# Patient Record
Sex: Male | Born: 1985 | Race: Black or African American | Hispanic: No | Marital: Single
Health system: Southern US, Community
[De-identification: ages and names within clinical notes are randomized; demographics above are authoritative.]

## PROBLEM LIST (undated history)

## (undated) DIAGNOSIS — K219 Gastro-esophageal reflux disease without esophagitis: Secondary | ICD-10-CM

---
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
## 2005-12-27 ENCOUNTER — Ambulatory Visit: Payer: Self-pay | Admitting: Family Medicine

## 2006-10-09 DIAGNOSIS — J309 Allergic rhinitis, unspecified: Secondary | ICD-10-CM | POA: Insufficient documentation

## 2006-10-09 DIAGNOSIS — L2089 Other atopic dermatitis: Secondary | ICD-10-CM | POA: Insufficient documentation

## 2006-10-09 DIAGNOSIS — K5900 Constipation, unspecified: Secondary | ICD-10-CM | POA: Insufficient documentation

## 2006-10-09 DIAGNOSIS — K449 Diaphragmatic hernia without obstruction or gangrene: Secondary | ICD-10-CM | POA: Insufficient documentation

## 2006-10-09 DIAGNOSIS — K219 Gastro-esophageal reflux disease without esophagitis: Secondary | ICD-10-CM

## 2007-10-05 ENCOUNTER — Emergency Department (HOSPITAL_COMMUNITY): Admission: EM | Admit: 2007-10-05 | Discharge: 2007-10-06 | Payer: Self-pay | Admitting: Emergency Medicine

## 2007-12-03 ENCOUNTER — Emergency Department: Payer: Self-pay | Admitting: Unknown Physician Specialty

## 2007-12-10 ENCOUNTER — Telehealth: Payer: Self-pay | Admitting: *Deleted

## 2008-09-03 ENCOUNTER — Emergency Department: Payer: Self-pay | Admitting: Emergency Medicine

## 2010-01-28 ENCOUNTER — Emergency Department (HOSPITAL_COMMUNITY): Admission: AC | Admit: 2010-01-28 | Discharge: 2010-01-28 | Payer: Self-pay

## 2010-10-28 LAB — COMPREHENSIVE METABOLIC PANEL
ALT: 25 U/L (ref 0–53)
AST: 27 U/L (ref 0–37)
Albumin: 4 g/dL (ref 3.5–5.2)
Alkaline Phosphatase: 47 U/L (ref 39–117)
CO2: 24 mEq/L (ref 19–32)
Chloride: 105 mEq/L (ref 96–112)
GFR calc Af Amer: >60 mL/min (ref >60)
GFR calc non Af Amer: 54 mL/min — ABNORMAL LOW (ref >60)
Potassium: 3.4 mEq/L — ABNORMAL LOW (ref 3.5–5.1)
Sodium: 137 mEq/L (ref 135–145)
Total Bilirubin: 0.5 mg/dL (ref 0.3–1.2)

## 2010-10-28 LAB — TYPE AND SCREEN: Antibody Screen: NEGATIVE

## 2010-10-28 LAB — APTT: aPTT: 25 seconds (ref 24–37)

## 2010-10-28 LAB — CBC
MCV: 88.2 fL (ref 78.0–100.0)
Platelets: 227 10*3/uL (ref 150–400)
RBC: 5.28 MIL/uL (ref 4.22–5.81)
WBC: 8.7 10*3/uL (ref 4.0–10.5)

## 2010-10-28 LAB — POCT I-STAT, CHEM 8
BUN: 14 mg/dL (ref 6–23)
Calcium, Ion: 1.06 mmol/L — ABNORMAL LOW (ref 1.12–1.32)
Chloride: 104 meq/L (ref 96–112)
Glucose, Bld: 138 mg/dL — ABNORMAL HIGH (ref 70–99)
HCT: 50 % (ref 39.0–52.0)
Potassium: 3.5 meq/L (ref 3.5–5.1)

## 2010-10-28 LAB — ABO/RH: ABO/RH(D): O POS

## 2011-05-03 LAB — POCT CARDIAC MARKERS
CKMB, poc: <1 — ABNORMAL LOW
Troponin i, poc: <0.05

## 2013-01-12 ENCOUNTER — Emergency Department (HOSPITAL_COMMUNITY): Payer: Self-pay

## 2013-01-12 ENCOUNTER — Encounter (HOSPITAL_COMMUNITY): Payer: Self-pay | Admitting: *Deleted

## 2013-01-12 ENCOUNTER — Emergency Department (HOSPITAL_COMMUNITY)
Admission: EM | Admit: 2013-01-12 | Discharge: 2013-01-12 | Disposition: A | Discharge status: Home / Self Care | Payer: Self-pay | Attending: Emergency Medicine | Admitting: Emergency Medicine

## 2013-01-12 DIAGNOSIS — S6720XA Crushing injury of unspecified hand, initial encounter: Secondary | ICD-10-CM | POA: Insufficient documentation

## 2013-01-12 DIAGNOSIS — Y939 Activity, unspecified: Secondary | ICD-10-CM | POA: Insufficient documentation

## 2013-01-12 DIAGNOSIS — W19XXXA Unspecified fall, initial encounter: Secondary | ICD-10-CM | POA: Insufficient documentation

## 2013-01-12 DIAGNOSIS — S6721XA Crushing injury of right hand, initial encounter: Secondary | ICD-10-CM

## 2013-01-12 DIAGNOSIS — Y929 Unspecified place or not applicable: Secondary | ICD-10-CM | POA: Insufficient documentation

## 2013-01-12 NOTE — ED Notes (Signed)
Pt in c/o right hand injury on Saturday night, states he fell on it, swelling noted

## 2013-01-12 NOTE — ED Provider Notes (Signed)
History     CSN: [id number]  Arrival date & time 01/12/13  0028   First MD Initiated Contact with Patient 01/12/13 [phone]      Chief Complaint  Patient presents with  . Hand Injury    (Consider location/radiation/quality/duration/timing/severity/associated sxs/prior treatment) Patient is a 27 y.o. male presenting with hand injury. The history is provided by the patient.  Hand Injury Location:  Hand Injury: yes   Hand location:  R hand Pain details:    Quality:  Aching   Radiates to:  Does not radiate   Severity:  Mild   Timing:  Constant   Progression:  Unchanged Chronicity:  New Handedness:  Right-handed Dislocation: no   Foreign body present:  No foreign bodies Prior injury to area:  No Relieved by:  None tried Worsened by:  Nothing tried Ineffective treatments:  None tried Associated symptoms: no fever     History reviewed. No pertinent past medical history.  No past surgical history on file.  No family history on file.  History  Substance Use Topics  . Smoking status: Not on file  . Smokeless tobacco: Not on file  . Alcohol Use: Not on file      Review of Systems  Unable to perform ROS Constitutional: Negative for fever.  Musculoskeletal: Positive for joint swelling.  Skin: Positive for color change.  All other systems reviewed and are negative.    Allergies  Review of patient's allergies indicates no known allergies.  Home Medications   Current Outpatient Rx  Name  Route  Sig  Dispense  Refill  . acetaminophen (TYLENOL) 500 MG tablet   Oral   Take 1,000 mg by mouth every 6 (six) hours as needed for pain.           BP 138/90  Pulse 81  Temp(Src) 98.3 F (36.8 C) (Oral)  Resp 18  Ht 6\' 3"  (1.905 m)  Wt 224 lb (101.606 kg)  BMI 28 kg/m2  SpO2 99%  Physical Exam  Constitutional: He appears well-developed and well-nourished.  Eyes: Pupils are equal, round, and reactive to light.  Neck: Normal range of motion.  Cardiovascular:  Normal rate.   Pulmonary/Chest: Effort normal.  Musculoskeletal: Normal range of motion. He exhibits tenderness. He exhibits no edema.       Hands: Ecchymotic, swelling    ED Course  Procedures (including critical care time)  Labs Reviewed - No data to display Dg Hand Complete Right  01/12/2013   *RADIOLOGY REPORT*  Clinical Data: History of fall complaining of right hand pain.  RIGHT HAND - COMPLETE 3+ VIEW  Comparison: No priors.  Findings: Three views of the right hand demonstrate no acute displaced fracture, subluxation, dislocation, joint or soft tissue abnormality.  IMPRESSION: 1.  No acute radiographic abnormality of the right hand.   Original Report Authenticated By: Trudie Reed, M.D.     1. Hand crush injury, right, initial encounter       MDM  Heat therapy and regular use         Arman Filter, NP 01/12/13 1191  Arman Filter, NP 01/12/13 [phone]

## 2013-01-12 NOTE — ED Provider Notes (Signed)
Medical screening examination/treatment/procedure(s) were performed by non-physician practitioner and as supervising physician I was immediately available for consultation/collaboration.  Jasmine Awe, MD 01/12/13 [phone]

## 2013-12-10 IMAGING — CR DG HAND COMPLETE 3+V*R*
3 series · 3 of 3 positions shown · non-contrast
Comparison: No priors.

CLINICAL DATA: History of fall complaining of right hand pain.

RIGHT HAND - COMPLETE 3+ VIEW

[x hand pa right]
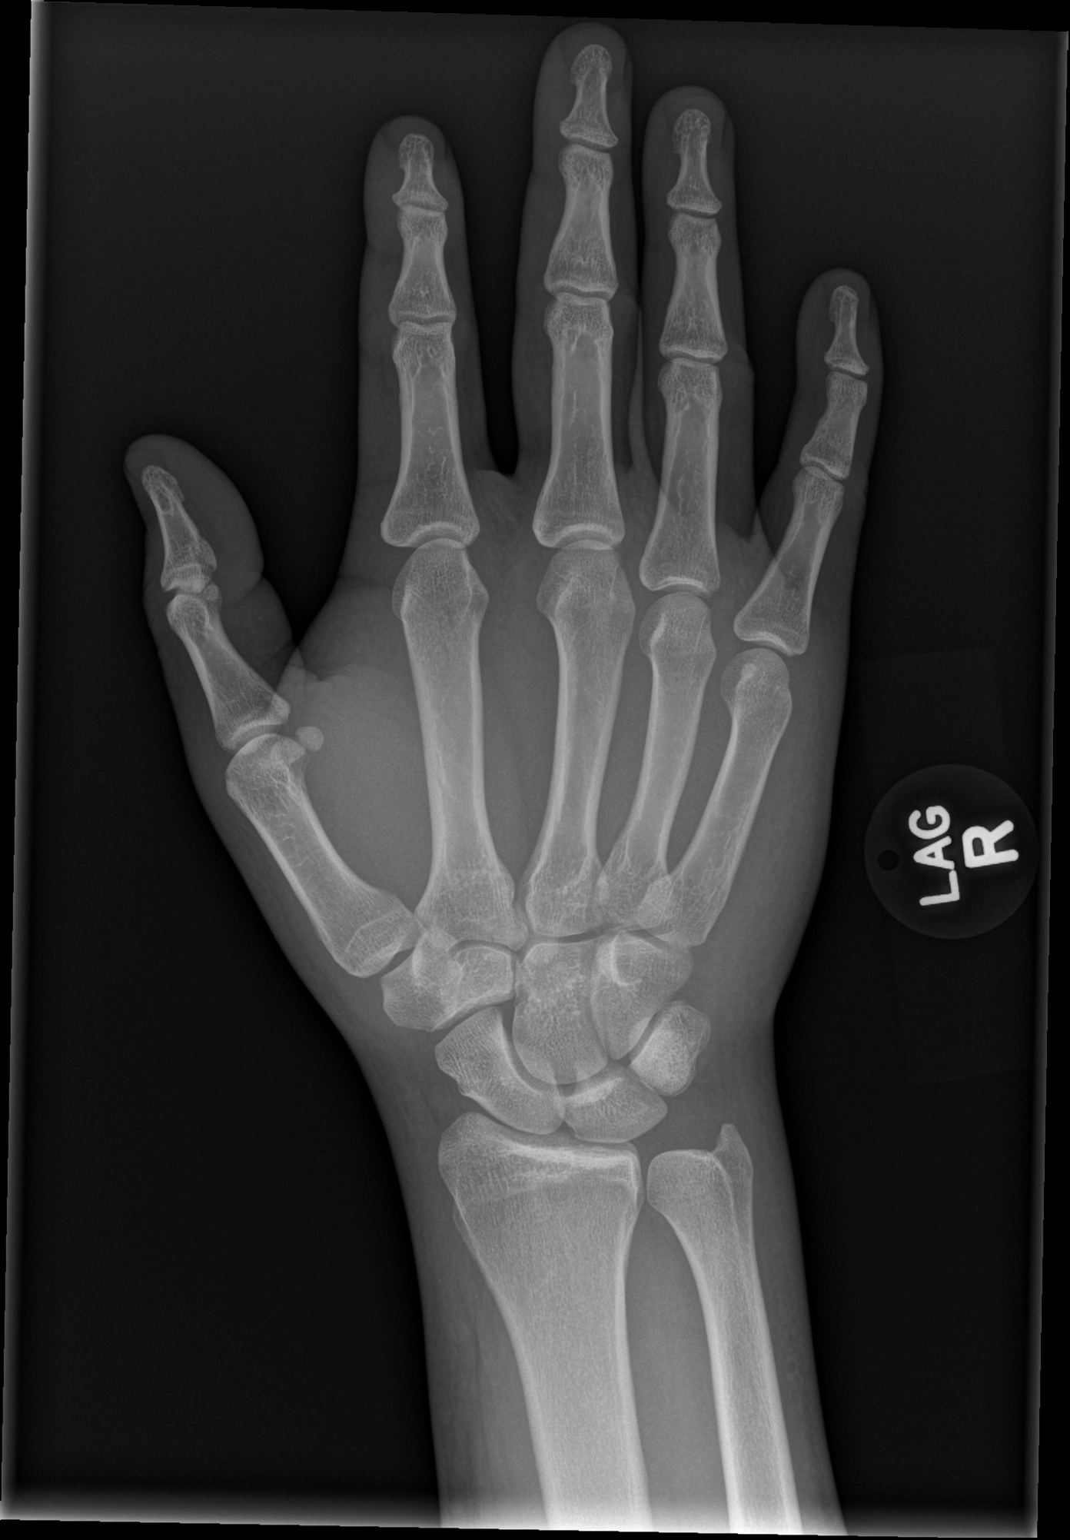

[x hand obl right]
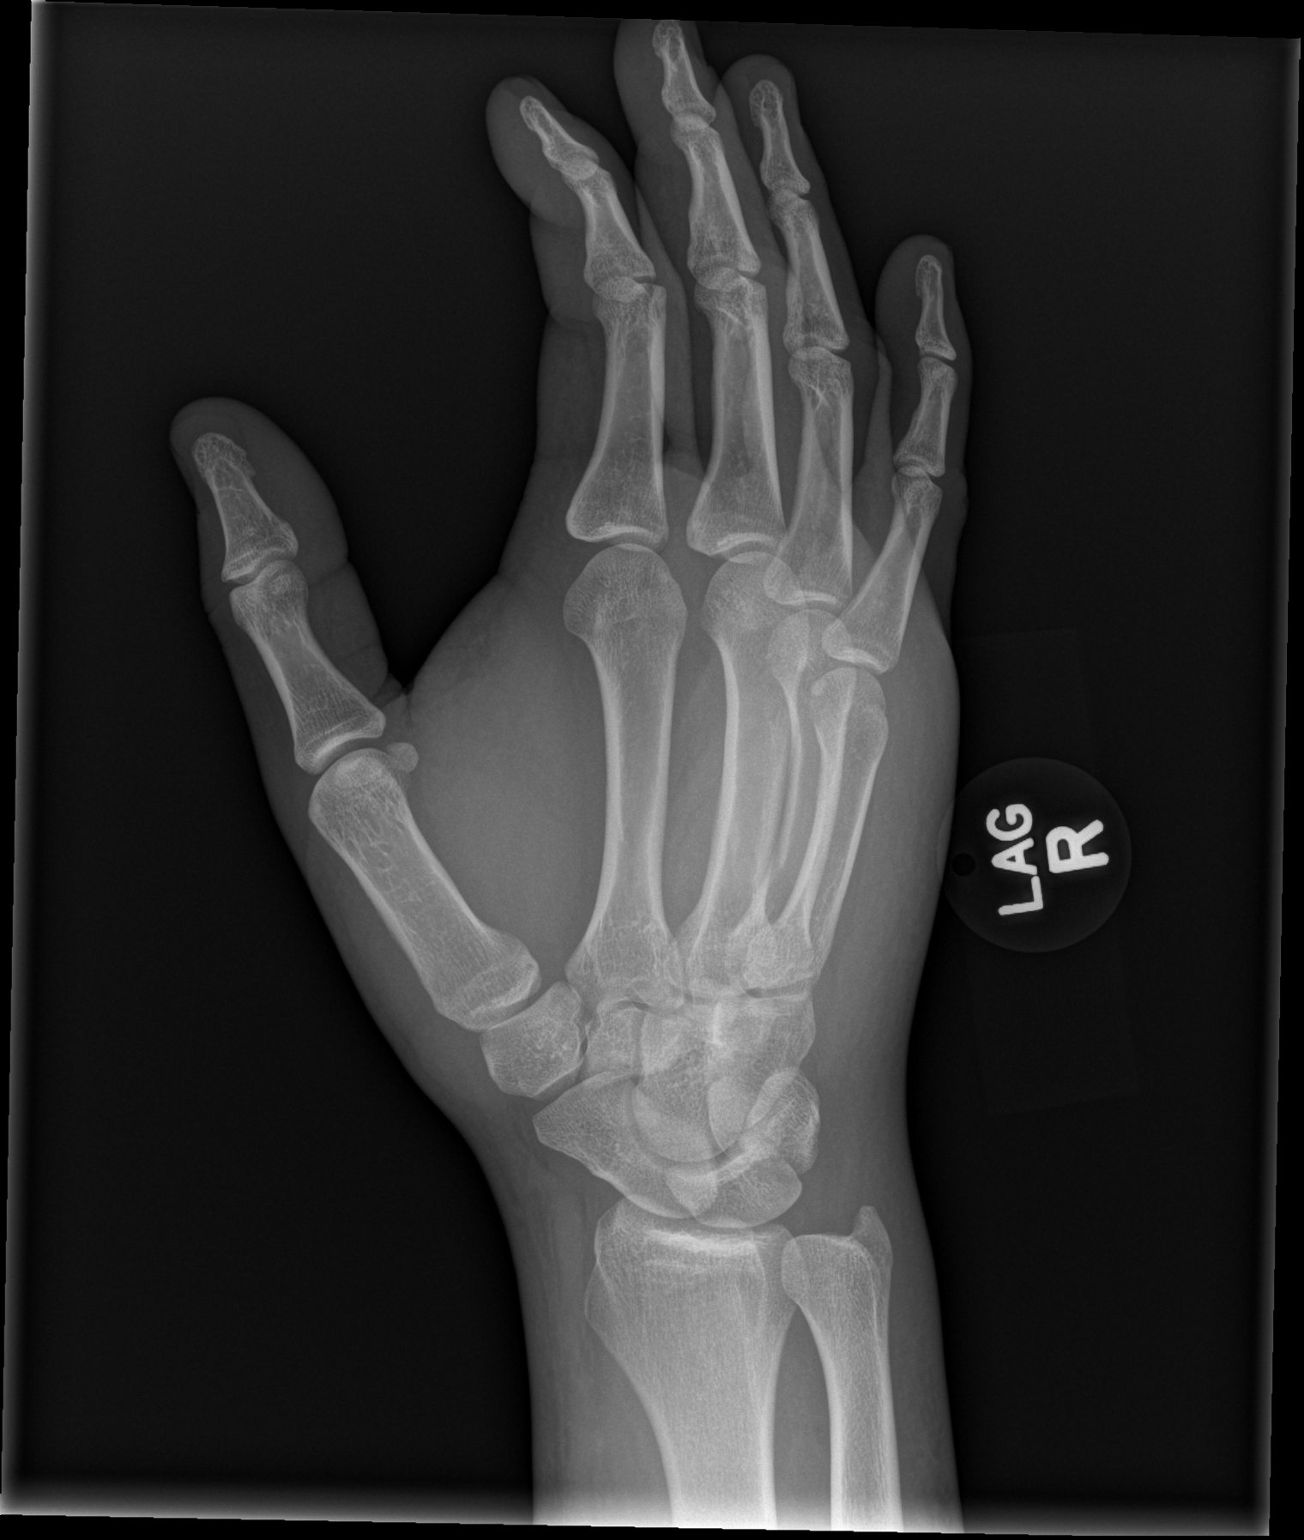

[x hand lat right]
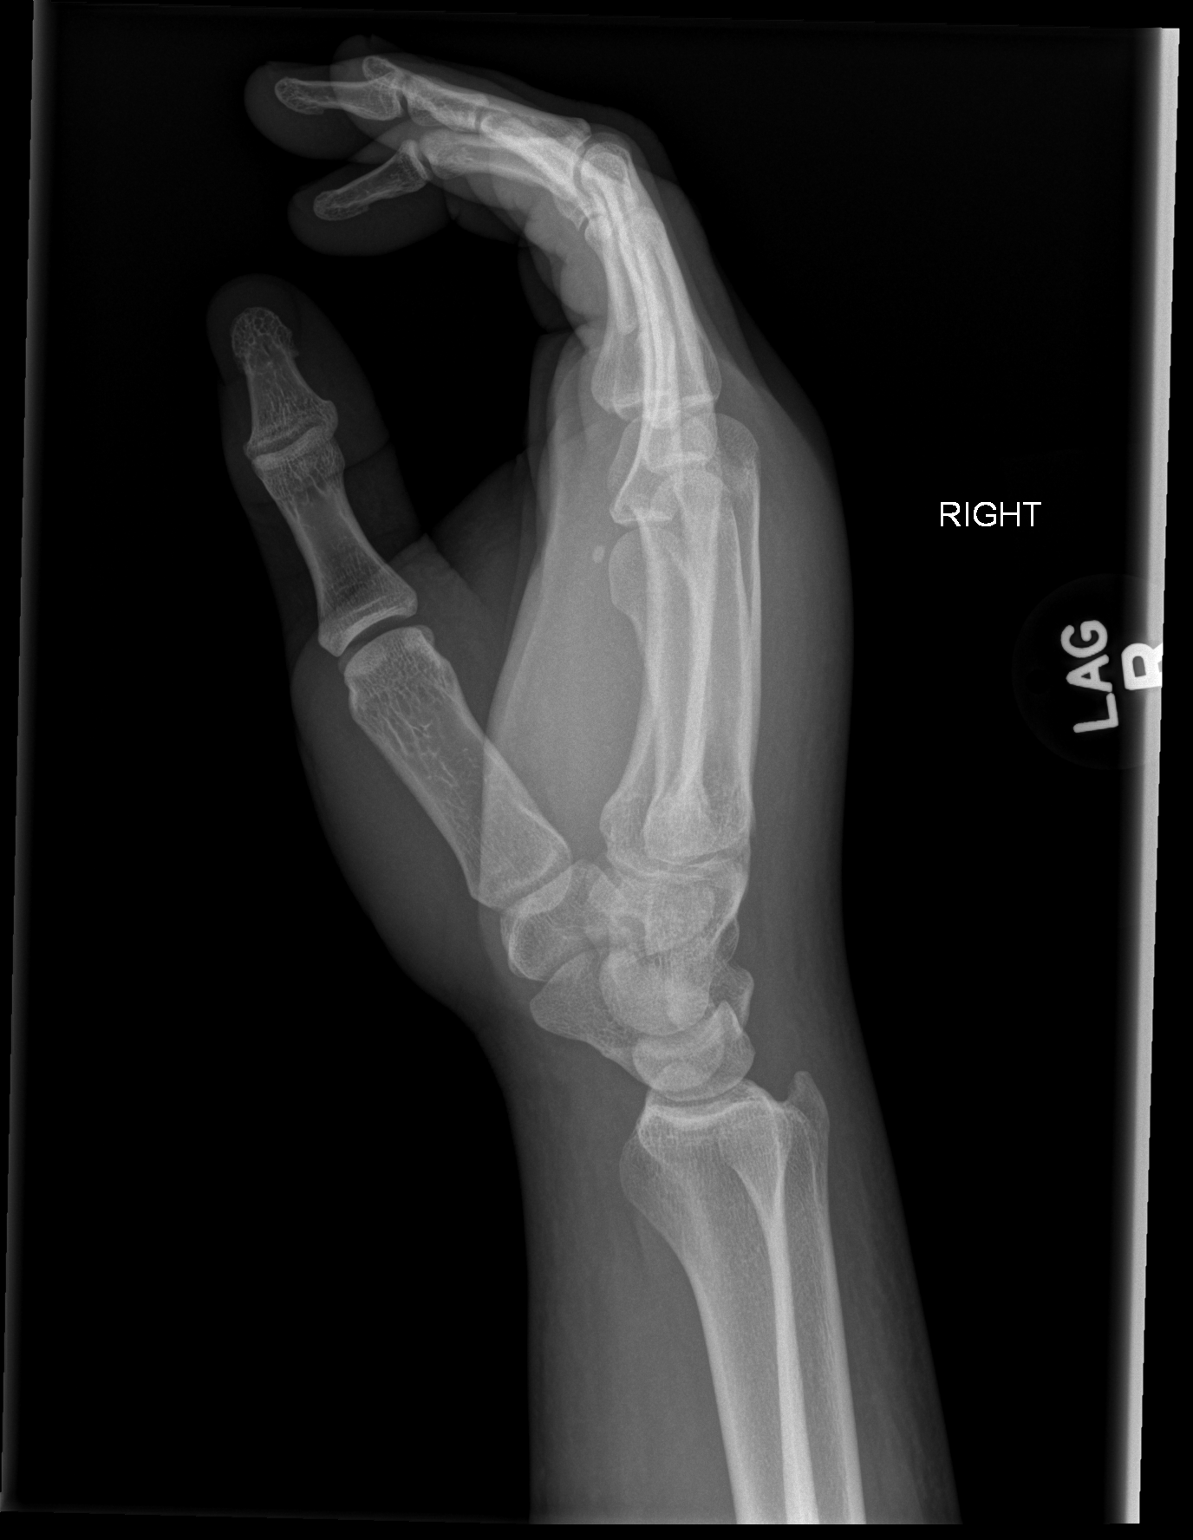

[3 of 3 positions shown; findings below may reference images not displayed]

FINDINGS: Three views of the right hand demonstrate no acute
displaced fracture, subluxation, dislocation, joint or soft tissue
abnormality.
IMPRESSION: 1.  No acute radiographic abnormality of the right hand.

## 2014-03-03 ENCOUNTER — Encounter (HOSPITAL_COMMUNITY): Payer: Self-pay | Admitting: Emergency Medicine

## 2014-03-03 ENCOUNTER — Emergency Department (HOSPITAL_COMMUNITY)
Admission: EM | Admit: 2014-03-03 | Discharge: 2014-03-04 | Disposition: A | Discharge status: Home / Self Care | Payer: Self-pay | Attending: Emergency Medicine | Admitting: Emergency Medicine

## 2014-03-03 ENCOUNTER — Emergency Department (HOSPITAL_COMMUNITY): Payer: Self-pay

## 2014-03-03 DIAGNOSIS — Z8719 Personal history of other diseases of the digestive system: Secondary | ICD-10-CM | POA: Insufficient documentation

## 2014-03-03 DIAGNOSIS — Z79899 Other long term (current) drug therapy: Secondary | ICD-10-CM | POA: Insufficient documentation

## 2014-03-03 DIAGNOSIS — F419 Anxiety disorder, unspecified: Secondary | ICD-10-CM

## 2014-03-03 DIAGNOSIS — R000 Tachycardia, unspecified: Secondary | ICD-10-CM | POA: Insufficient documentation

## 2014-03-03 DIAGNOSIS — F411 Generalized anxiety disorder: Secondary | ICD-10-CM | POA: Insufficient documentation

## 2014-03-03 DIAGNOSIS — R0602 Shortness of breath: Secondary | ICD-10-CM | POA: Insufficient documentation

## 2014-03-03 DIAGNOSIS — R0789 Other chest pain: Secondary | ICD-10-CM | POA: Insufficient documentation

## 2014-03-03 DIAGNOSIS — R079 Chest pain, unspecified: Secondary | ICD-10-CM | POA: Insufficient documentation

## 2014-03-03 HISTORY — DX: Gastro-esophageal reflux disease without esophagitis: K21.9

## 2014-03-03 LAB — BASIC METABOLIC PANEL
ANION GAP: 19 — AB (ref 5–15)
BUN: 12 mg/dL (ref 6–23)
CALCIUM: 9.9 mg/dL (ref 8.4–10.5)
CO2: 20 meq/L (ref 19–32)
CREATININE: 1.31 mg/dL (ref 0.50–1.35)
Chloride: 97 mEq/L (ref 96–112)
GFR calc Af Amer: 84 mL/min — ABNORMAL LOW (ref >90)
GFR calc non Af Amer: 73 mL/min — ABNORMAL LOW (ref >90)
Glucose, Bld: 123 mg/dL — ABNORMAL HIGH (ref 70–99)
Potassium: 3.7 mEq/L (ref 3.7–5.3)
Sodium: 136 mEq/L — ABNORMAL LOW (ref 137–147)

## 2014-03-03 LAB — CBC
HEMATOCRIT: 49.4 % (ref 39.0–52.0)
Hemoglobin: 17.3 g/dL — ABNORMAL HIGH (ref 13.0–17.0)
MCH: 29.2 pg (ref 26.0–34.0)
MCHC: 35 g/dL (ref 30.0–36.0)
MCV: 83.3 fL (ref 78.0–100.0)
PLATELETS: 271 10*3/uL (ref 150–400)
RBC: 5.93 MIL/uL — AB (ref 4.22–5.81)
RDW: 12.8 % (ref 11.5–15.5)
WBC: 5.7 10*3/uL (ref 4.0–10.5)

## 2014-03-03 LAB — D-DIMER, QUANTITATIVE: D-Dimer, Quant: <0.27 ug/mL-FEU (ref 0.00–0.48)

## 2014-03-03 LAB — I-STAT TROPONIN, ED: Troponin i, poc: 0 ng/mL (ref 0.00–0.08)

## 2014-03-03 MED ORDER — SODIUM CHLORIDE 0.9 % IV BOLUS (SEPSIS)
1000.0000 mL | Freq: Once | INTRAVENOUS | Status: AC
  Administered 2014-03-03: 1000 mL via INTRAVENOUS

## 2014-03-03 MED ORDER — LORAZEPAM 2 MG/ML IJ SOLN
0.5000 mg | Freq: Once | INTRAMUSCULAR | Status: AC
  Administered 2014-03-03: 0.5 mg via INTRAVENOUS
  Filled 2014-03-03: qty 1

## 2014-03-03 NOTE — ED Notes (Signed)
Patient with c/o "chest pain and shortness of breath" since yesterday Patient states that he has a hx of "acid reflux" which was unrelieved by Tums Patient noted to be in ST on monitor Patient denies N/V/D Patient noted to be hypertensive--denies blurred vision, but endorses a headache

## 2014-03-03 NOTE — ED Provider Notes (Signed)
CSN: [id number]     Arrival date & time 03/03/14  2115 History   First MD Initiated Contact with Patient 03/03/14 2147     Chief Complaint  Patient presents with  . Chest Pain  . Shortness of Breath     (Consider location/radiation/quality/duration/timing/severity/associated sxs/prior Treatment) HPI Patient states he was watching a frightening movie last night and started having chest tightness and shortness of breath. No previous episodes of similar. Denies any cough, fever or chills. Chest tightness is mostly but located in the right chest. It does not radiate. No recent travel or surgeries. No calf swelling or pain. A family history of DVT/PE or coronary artery disease. Patient denies smoking or drug use. Denies excessive caffeine intake. States he drinks occasionally Past Medical History  Diagnosis Date  . GERD (gastroesophageal reflux disease)    History reviewed. No pertinent past surgical history. History reviewed. No pertinent family history. History  Substance Use Topics  . Smoking status: Unknown If Ever Smoked  . Smokeless tobacco: Not on file  . Alcohol Use: No    Review of Systems  Constitutional: Negative for fever and chills.  Eyes: Negative for visual disturbance.  Respiratory: Positive for shortness of breath. Negative for cough, chest tightness and wheezing.   Cardiovascular: Positive for chest pain. Negative for palpitations.  Gastrointestinal: Negative for nausea, vomiting, abdominal pain, diarrhea and constipation.  Musculoskeletal: Negative for back pain, myalgias, neck pain and neck stiffness.  Skin: Negative for rash and wound.  Neurological: Negative for dizziness, weakness, light-headedness, numbness and headaches.  All other systems reviewed and are negative.     Allergies  Review of patient's allergies indicates no known allergies.  Home Medications   Prior to Admission medications   Medication Sig Start Date End Date Taking? Authorizing  Provider  acetaminophen (TYLENOL) 500 MG tablet Take 500 mg by mouth every 6 (six) hours as needed (pain).    Yes Historical Provider, MD   BP 187/102  Pulse 128  Temp(Src) 98.6 F (37 C)  Resp 22  SpO2 100% Physical Exam  Nursing note and vitals reviewed. Constitutional: He is oriented to person, place, and time. He appears well-developed and well-nourished. No distress.  HENT:  Head: Normocephalic and atraumatic.  Mouth/Throat: Oropharynx is clear and moist.  Eyes: EOM are normal. Pupils are equal, round, and reactive to light.  Pupils 4-5 mm bilaterally and reactive  Neck: Normal range of motion. Neck supple.  No meningismus  Cardiovascular: Regular rhythm.   Tachycardia  Pulmonary/Chest: Effort normal and breath sounds normal. No respiratory distress. He has no wheezes. He has no rales. He exhibits no tenderness.  Gynecomastia  Abdominal: Soft. Bowel sounds are normal. He exhibits no distension and no mass. There is no tenderness. There is no rebound and no guarding.  Musculoskeletal: Normal range of motion. He exhibits no edema and no tenderness.  No calf swelling or tenderness.  Neurological: He is alert and oriented to person, place, and time.  Moves all extremities without deficit. Sensation is grossly intact.  Skin: Skin is warm and dry. No rash noted. No erythema.  Psychiatric: He has a normal mood and affect. His behavior is normal.    ED Course  Procedures (including critical care time) Labs Review Labs Reviewed  CBC - Abnormal; Notable for the following:    RBC 5.93 (*)    Hemoglobin 17.3 (*)    All other components within normal limits  BASIC METABOLIC PANEL - Abnormal; Notable for the following:  Sodium 136 (*)    Glucose, Bld 123 (*)    GFR calc non Af Amer 73 (*)    GFR calc Af Amer 84 (*)    Anion gap 19 (*)    All other components within normal limits  D-DIMER, QUANTITATIVE  URINE RAPID DRUG SCREEN (HOSP PERFORMED)  [facility] SensorI-STAT TROPOININ, ED     Imaging Review Dg Chest Port 1 View  03/03/2014   CLINICAL DATA:  Chest pain for 2 days. Shortness of breath, chills and weakness.  EXAM: PORTABLE CHEST - 1 VIEW  COMPARISON:  Chest radiograph performed 10/06/2007  FINDINGS: The lungs are well-aerated. Pulmonary vascularity is at the upper limits of normal. There is no evidence of focal opacification, pleural effusion or pneumothorax.  The cardiomediastinal silhouette is within normal limits. No acute osseous abnormalities are seen. A tiny metallic density overlying the right lung apex may be outside the patient, possibly reflecting debris.  IMPRESSION: No acute cardiopulmonary process seen.   Electronically Signed   By: Roanna RaiderJeffery  Chang M.D.   On: 03/03/2014 22:18     EKG Interpretation   Date/Time:  Thursday March 03 2014 21:26:03 EDT Ventricular Rate:  119 PR Interval:  115 QRS Duration: 88 QT Interval:  464 QTC Calculation: 653 R Axis:   82 Text Interpretation:  Sinus tachycardia Prolonged QT interval Confirmed by  HORTON  MD, COURTNEY ([id number]) on 03/03/2014 9:57:56 PM      MDM   Final diagnoses:  None      Patient states he is feeling much better after IV Ativan and fluids. Heart rate is now under 100. Workup is essentially negative. Chest tightness and shortness of breath likely related to anxiety. Will start on low-dose benzoin as needed. Patient advised to return for worsening symptoms or any concerns.  Loren Raceravid Kolden Dupee, MD 03/04/14 [phone]

## 2014-03-04 LAB — RAPID URINE DRUG SCREEN, HOSP PERFORMED
AMPHETAMINES: NOT DETECTED
BENZODIAZEPINES: NOT DETECTED
Barbiturates: NOT DETECTED
COCAINE: NOT DETECTED
OPIATES: NOT DETECTED
TETRAHYDROCANNABINOL: NOT DETECTED

## 2014-03-04 MED ORDER — ALPRAZOLAM 0.25 MG PO TABS: 0.2500 mg | ORAL_TABLET | Freq: Three times a day (TID) | ORAL | Status: AC | PRN

## 2014-03-04 NOTE — Discharge Instructions (Signed)
Panic Attacks Panic attacks are sudden, short feelings of great fear or discomfort. You may have them for no reason when you are relaxed, when you are uneasy (anxious), or when you are sleeping.  HOME CARE  Take all your medicines as told.  Check with your doctor before starting new medicines.  Keep all doctor visits. GET HELP IF:  You are not able to take your medicines as told.  Your symptoms do not get better.  Your symptoms get worse. GET HELP RIGHT AWAY IF:  Your attacks seem different than your normal attacks.  You have thoughts about hurting yourself or others.  You take panic attack medicine and you have a side effect. MAKE SURE YOU:  Understand these instructions.  Will watch your condition.  Will get help right away if you are not doing well or get worse. Document Released: 08/31/2010 Document Revised: 05/19/2013 Document Reviewed: 03/12/2013 [facility] Patient Information 2015 [place], [place]. This information is not intended to replace advice given to you by your health care provider. Make sure you discuss any questions you have with your health care provider.  Chest Pain (Nonspecific) It is often hard to give a specific diagnosis for the cause of chest pain. There is always a chance that your pain could be related to something serious, such as a heart attack or a blood clot in the lungs. You need to follow up with your health care provider for further evaluation. CAUSES   Heartburn.  Pneumonia or bronchitis.  Anxiety or stress.  Inflammation around your heart (pericarditis) or lung (pleuritis or pleurisy).  A blood clot in the lung.  A collapsed lung (pneumothorax). It can develop suddenly on its own (spontaneous pneumothorax) or from trauma to the chest.  Shingles infection (herpes zoster virus). The chest wall is composed of bones, muscles, and cartilage. Any of these can be the source of the pain.  The bones can be bruised by injury.  The muscles or  cartilage can be strained by coughing or overwork.  The cartilage can be affected by inflammation and become sore (costochondritis). DIAGNOSIS  Lab tests or other studies may be needed to find the cause of your pain. Your health care provider may have you take a test called an ambulatory electrocardiogram (ECG). An ECG records your heartbeat patterns over a 24-hour period. You may also have other tests, such as:  Transthoracic echocardiogram (TTE). During echocardiography, sound waves are used to evaluate how blood flows through your heart.  Transesophageal echocardiogram (TEE).  Cardiac monitoring. This allows your health care provider to monitor your heart rate and rhythm in real time.  Holter monitor. This is a portable device that records your heartbeat and can help diagnose heart arrhythmias. It allows your health care provider to track your heart activity for several days, if needed.  Stress tests by exercise or by giving medicine that makes the heart beat faster. TREATMENT   Treatment depends on what may be causing your chest pain. Treatment may include:  Acid blockers for heartburn.  Anti-inflammatory medicine.  Pain medicine for inflammatory conditions.  Antibiotics if an infection is present.  You may be advised to change lifestyle habits. This includes stopping smoking and avoiding alcohol, caffeine, and chocolate.  You may be advised to keep your head raised (elevated) when sleeping. This reduces the chance of acid going backward from your stomach into your esophagus. Most of the time, nonspecific chest pain will improve within 2-3 days with rest and mild pain medicine.  HOME  INSTRUCTIONS  °· If antibiotics were prescribed, take them as directed. Finish them even if you start to feel better. °· For the next few days, avoid physical activities that bring on chest pain. Continue physical activities as directed. °· Do not use any tobacco products, including cigarettes,  chewing tobacco, or electronic cigarettes. °· Avoid drinking alcohol. °· Only take medicine as directed by your health care provider. °· Follow your health care provider's suggestions for further testing if your chest pain does not go away. °· Keep any follow-up appointments you made. If you do not go to an appointment, you could develop lasting (chronic) problems with pain. If there is any problem keeping an appointment, call to reschedule. °SEEK MEDICAL CARE IF:  °· Your chest pain does not go away, even after treatment. °· You have a rash with blisters on your chest. °· You have a fever. °SEEK IMMEDIATE MEDICAL CARE IF:  °· You have increased chest pain or pain that spreads to your arm, neck, jaw, back, or abdomen. °· You have shortness of breath. °· You have an increasing cough, or you cough up blood. °· You have severe back or abdominal pain. °· You feel nauseous or vomit. °· You have severe weakness. °· You faint. °· You have chills. °This is an emergency. Do not wait to see if the pain will go away. Get medical help at once. Call your local emergency services (911 in [place] Do not drive yourself to the hospital. °MAKE SURE YOU:  °· Understand these instructions. °· Will watch your condition. °· Will get help right away if you are not doing well or get worse. °Document Released: 05/08/2005 Document Revised: 08/03/2013 Document Reviewed: 03/03/2008 °ExitCare® Patient Information ©2015 [employer]. This information is not intended to replace advice given to you by your health care provider. Make sure you discuss any questions you have with your health care provider. ° °

## 2014-04-07 ENCOUNTER — Ambulatory Visit: Payer: Self-pay

## 2014-05-13 ENCOUNTER — Encounter (HOSPITAL_COMMUNITY): Payer: Self-pay | Admitting: Emergency Medicine

## 2014-05-13 ENCOUNTER — Emergency Department (INDEPENDENT_AMBULATORY_CARE_PROVIDER_SITE_OTHER)
Admission: EM | Admit: 2014-05-13 | Discharge: 2014-05-13 | Disposition: A | Discharge status: Home / Self Care | Payer: PRIVATE HEALTH INSURANCE | Source: Home / Self Care | Attending: Family Medicine | Admitting: Family Medicine

## 2014-05-13 DIAGNOSIS — I10 Essential (primary) hypertension: Secondary | ICD-10-CM

## 2014-05-13 LAB — POCT I-STAT, CHEM 8
BUN: 9 mg/dL (ref 6–23)
Calcium, Ion: 1.13 mmol/L (ref 1.12–1.23)
Chloride: 103 mEq/L (ref 96–112)
Creatinine, Ser: 1.4 mg/dL — ABNORMAL HIGH (ref 0.50–1.35)
Glucose, Bld: 126 mg/dL — ABNORMAL HIGH (ref 70–99)
HEMATOCRIT: 51 % (ref 39.0–52.0)
HEMOGLOBIN: 17.3 g/dL — AB (ref 13.0–17.0)
POTASSIUM: 3.5 meq/L — AB (ref 3.7–5.3)
SODIUM: 142 meq/L (ref 137–147)
TCO2: 28 mmol/L (ref 0–100)

## 2014-05-13 MED ORDER — METOPROLOL TARTRATE 25 MG PO TABS: 100.0000 mg | ORAL_TABLET | Freq: Two times a day (BID) | ORAL | Status: DC

## 2014-05-13 MED ORDER — METOPROLOL TARTRATE 25 MG PO TABS: 25.0000 mg | ORAL_TABLET | Freq: Two times a day (BID) | ORAL | Status: AC

## 2014-05-13 MED ORDER — HYDROCHLOROTHIAZIDE 12.5 MG PO TABS: 12.5000 mg | ORAL_TABLET | Freq: Every day | ORAL | Status: AC

## 2014-05-13 NOTE — ED Notes (Signed)
Con[facility] about HTN [facility] BP today is 151/88 and pulse is 124 Asymptomatic Alert, no signs of acute distress.

## 2014-05-13 NOTE — ED Provider Notes (Addendum)
Matthew Short is a 28 y.o. male who presents to Urgent Care today for elevated blood pressure. Patient has a several month history of elevated blood pressure associated with intermittent headache. He currently feels well no chest pain palpitations or shortness of breath. He is having trouble establishing with a primary care physician is a does not have health insurance.   Past Medical History  Diagnosis Date  . GERD (gastroesophageal reflux disease)    History  Substance Use Topics  . Smoking status: Unknown If Ever Smoked  . Smokeless tobacco: Not on file  . Alcohol Use: No   ROS as above Medications: No current facility-administered medications for this encounter.   Current Outpatient Prescriptions  Medication Sig Dispense Refill  . acetaminophen (TYLENOL) 500 MG tablet Take 500 mg by mouth every 6 (six) hours as needed (pain).       Marland Kitchen. ALPRAZolam (XANAX) 0.25 MG tablet Take 1 tablet (0.25 mg total) by mouth 3 (three) times daily as needed for anxiety.  10 tablet  0  . hydrochlorothiazide (HYDRODIURIL) 12.5 MG tablet Take 1 tablet (12.5 mg total) by mouth daily.  30 tablet  2  . metoprolol (LOPRESSOR) 25 MG tablet Take 4 tablets (100 mg total) by mouth 2 (two) times daily.  60 tablet  1    Exam:  BP 151/88  Pulse 124  Temp(Src) 97.5 F (36.4 C) (Oral)  Resp 18  SpO2 98% Gen: Well NAD HEENT: EOMI,  MMM Lungs: Normal work of breathing. CTABL Heart: RRR no MRG heart rate 90 beats per minute per manual check. Abd: NABS, Soft. Nondistended, Nontender Exts: Brisk capillary refill, warm and well perfused.   Twelve-lead EKG shows normal sinus rhythm at 93 beats per minute. No ST segment elevation or depression.  Q waves in lead II, III, and V6.  Unchanged from prior EKG  Results for orders placed during the hospital encounter of 05/13/14 (from the past 24 hour(s))  POCT I-STAT, CHEM 8     Status: Abnormal   Collection Time    05/13/14  9:51 AM      Result Value Ref Range   Sodium 142  137 - 147 mEq/L   Potassium 3.5 (*) 3.7 - 5.3 mEq/L   Chloride 103  96 - 112 mEq/L   BUN 9  6 - 23 mg/dL   Creatinine, Ser 5.621.40 (*) 0.50 - 1.35 mg/dL   Glucose, Bld 130126 (*) 70 - 99 mg/dL   Calcium, Ion 8.651.13  7.841.12 - 1.23 mmol/L   TCO2 28  0 - 100 mmol/L   Hemoglobin 17.3 (*) 13.0 - 17.0 g/dL   HCT 69.651.0  29.539.0 - 28.452.0 %  GFR 78 No results found.  Assessment and Plan: 28 y.o. male with  Hypertension. Plan to treat with low-dose hydrochlorothiazide and metoprolol. [facility] Heart rate normalized with EKG. Old Q waves unchanged. PCP possible referral to cardiology for further evaluation in the future.  Discussed warning signs or symptoms. Please see discharge instructions. Patient expresses understanding.     Rodolph BongEvan S Loriene Taunton, MD 05/13/14 1006  Rodolph BongEvan S Natina Wiginton, MD 05/13/14 1009

## 2014-05-13 NOTE — Discharge Instructions (Signed)
Thank you for coming in today. Take hydrochlorothiazide daily and metoprolol twice daily for blood pressure control. Call or go to the emergency room if you get worse, have trouble breathing, have chest pains, or palpitations.   Hypertension Hypertension, commonly called high blood pressure, is when the force of blood pumping through your arteries is too strong. Your arteries are the blood vessels that carry blood from your heart throughout your body. A blood pressure reading consists of a higher number over a lower number, such as 110/72. The higher number (systolic) is the pressure inside your arteries when your heart pumps. The lower number (diastolic) is the pressure inside your arteries when your heart relaxes. Ideally you want your blood pressure below 120/80. Hypertension forces your heart to work harder to pump blood. Your arteries may become narrow or stiff. Having hypertension puts you at risk for heart disease, stroke, and other problems.  RISK FACTORS Some risk factors for high blood pressure are controllable. Others are not.  Risk factors you cannot control include:   Race. You may be at higher risk if you are African American.  Age. Risk increases with age.  Gender. Men are at higher risk than women before age 20 years. After age 24, women are at higher risk than men. Risk factors you can control include:  Not getting enough exercise or physical activity.  Being overweight.  Getting too much fat, sugar, calories, or salt in your diet.  Drinking too much alcohol. SIGNS AND SYMPTOMS Hypertension does not usually cause signs or symptoms. Extremely high blood pressure (hypertensive crisis) may cause headache, anxiety, shortness of breath, and nosebleed. DIAGNOSIS  To check if you have hypertension, your health care provider will measure your blood pressure while you are seated, with your arm held at the level of your heart. It should be measured at least twice using the same  arm. Certain conditions can cause a difference in blood pressure between your right and left arms. A blood pressure reading that is higher than normal on one occasion does not mean that you need treatment. If one blood pressure reading is high, ask your health care provider about having it checked again. TREATMENT  Treating high blood pressure includes making lifestyle changes and possibly taking medicine. Living a healthy lifestyle can help lower high blood pressure. You may need to change some of your habits. Lifestyle changes may include:  Following the DASH diet. This diet is high in fruits, vegetables, and whole grains. It is low in salt, red meat, and added sugars.  Getting at least 2 hours of brisk physical activity every week.  Losing weight if necessary.  Not smoking.  Limiting alcoholic beverages.  Learning ways to reduce stress. If lifestyle changes are not enough to get your blood pressure under control, your health care provider may prescribe medicine. You may need to take more than one. Work closely with your health care provider to understand the risks and benefits. HOME CARE INSTRUCTIONS  Have your blood pressure rechecked as directed by your health care provider.   Take medicines only as directed by your health care provider. Follow the directions carefully. Blood pressure medicines must be taken as prescribed. The medicine does not work as well when you skip doses. Skipping doses also puts you at risk for problems.   Do not smoke.   Monitor your blood pressure at home as directed by your health care provider. SEEK MEDICAL CARE IF:   You think you are having a reaction  to medicines taken.  You have recurrent headaches or feel dizzy.  You have swelling in your ankles.  You have trouble with your vision. SEEK IMMEDIATE MEDICAL CARE IF:  You develop a severe headache or confusion.  You have unusual weakness, numbness, or feel faint.  You have severe chest  or abdominal pain.  You vomit repeatedly.  You have trouble breathing. MAKE SURE YOU:   Understand these instructions.  Will watch your condition.  Will get help right away if you are not doing well or get worse. Document Released: 07/29/2005 Document Revised: 12/13/2013 Document Reviewed: 05/21/2013 [facility] Patient Information 2015 [place], [place]. This information is not intended to replace advice given to you by your health care provider. Make sure you discuss any questions you have with your health care provider.

## 2014-05-20 ENCOUNTER — Ambulatory Visit: Payer: PRIVATE HEALTH INSURANCE | Admitting: Family Medicine

## 2015-01-29 IMAGING — CR DG CHEST 1V PORT
1 series · 1 of 1 positions shown · non-contrast
Comparison: Chest radiograph performed 10/06/2007

CLINICAL DATA: Chest pain for 2 days. Shortness of breath, chills
and weakness.

EXAM:
PORTABLE CHEST - 1 VIEW

[AP]
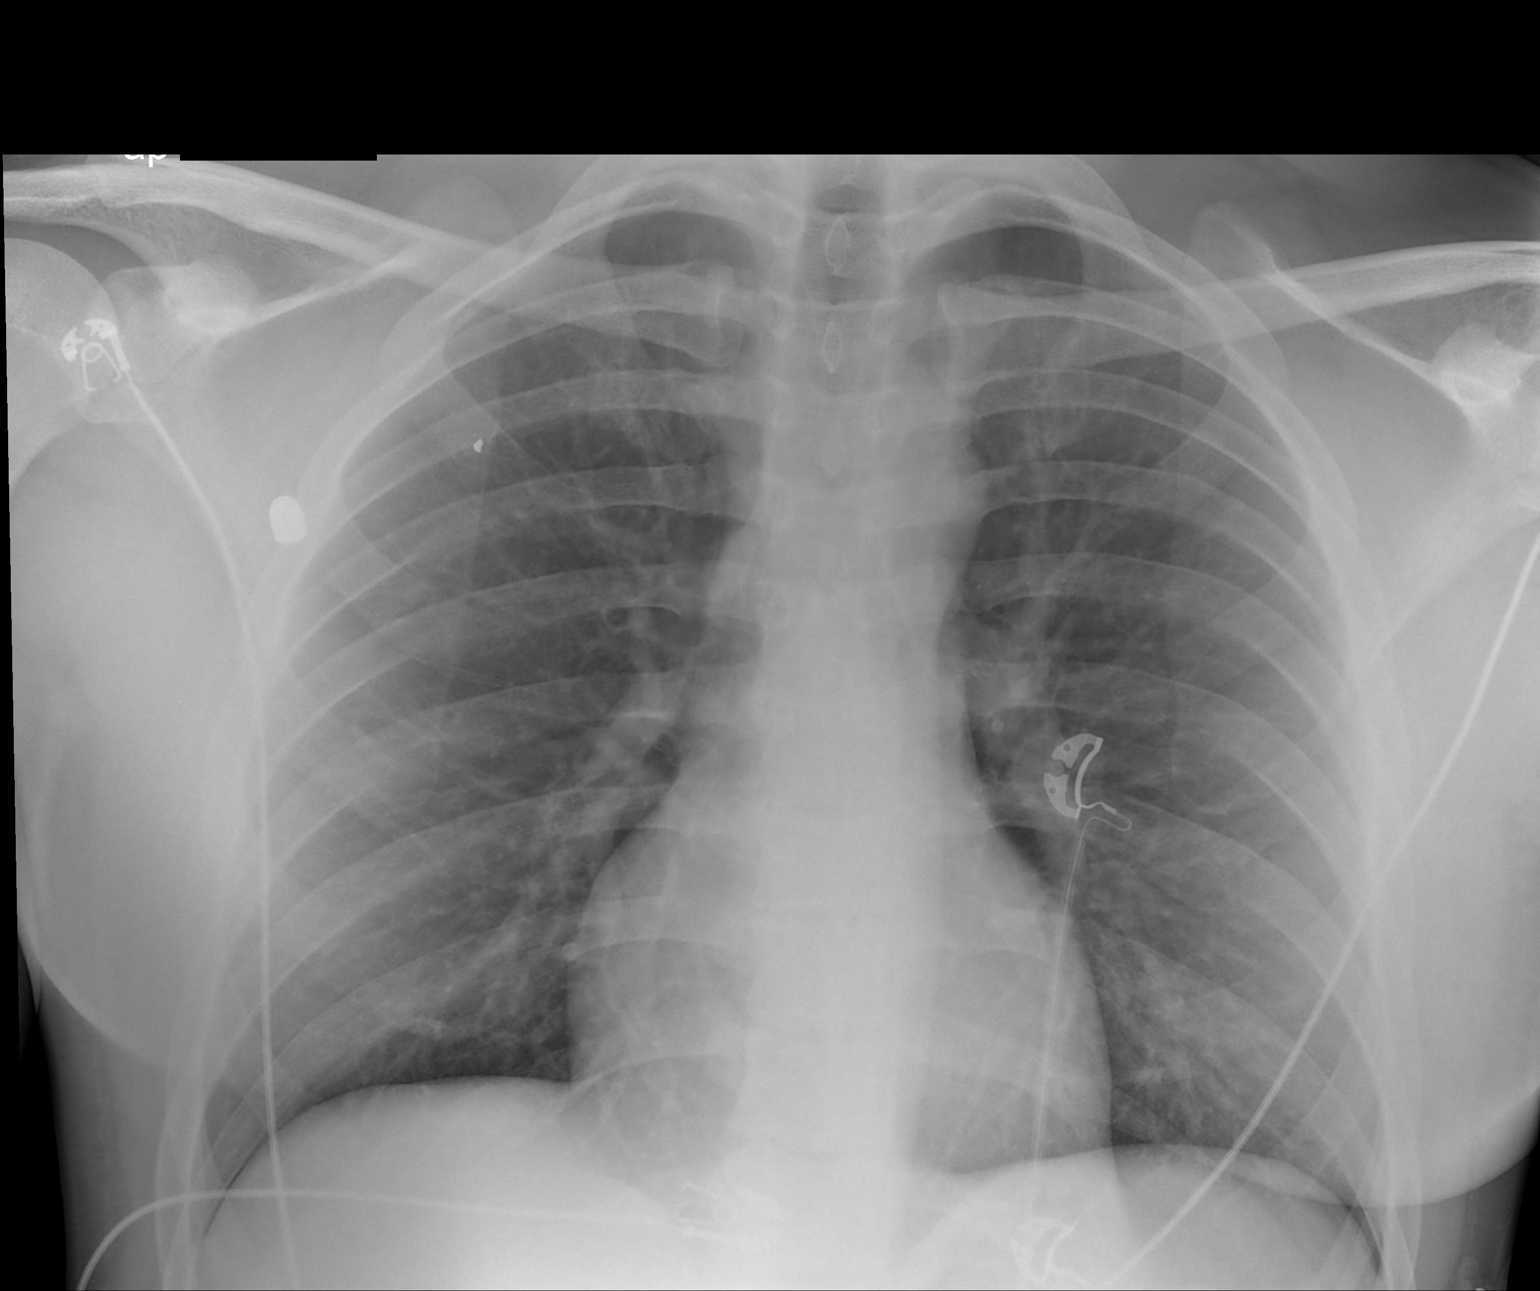

[1 of 1 positions shown; findings below may reference images not displayed]

FINDINGS: The lungs are well-aerated. Pulmonary vascularity is at the upper
limits of normal. There is no evidence of focal opacification,
pleural effusion or pneumothorax.

The cardiomediastinal silhouette is within normal limits. No acute
osseous abnormalities are seen. A tiny metallic density overlying
the right lung apex may be outside the patient, possibly reflecting
debris.
IMPRESSION: No acute cardiopulmonary process seen.

## 2017-04-09 ENCOUNTER — Emergency Department (HOSPITAL_COMMUNITY)
Admission: EM | Admit: 2017-04-09 | Discharge: 2017-04-09 | Disposition: A | Discharge status: Home / Self Care | Payer: Self-pay | Attending: Emergency Medicine | Admitting: Emergency Medicine

## 2017-04-09 DIAGNOSIS — I10 Essential (primary) hypertension: Secondary | ICD-10-CM | POA: Insufficient documentation

## 2017-04-09 DIAGNOSIS — F439 Reaction to severe stress, unspecified: Secondary | ICD-10-CM | POA: Insufficient documentation

## 2017-04-09 LAB — BASIC METABOLIC PANEL
Anion gap: 10 (ref 5–15)
BUN: 10 mg/dL (ref 6–20)
CO2: 25 mmol/L (ref 22–32)
Calcium: 9.8 mg/dL (ref 8.9–10.3)
Chloride: 105 mmol/L (ref 101–111)
Creatinine, Ser: 1.4 mg/dL — ABNORMAL HIGH (ref 0.61–1.24)
GFR calc Af Amer: >60 mL/min (ref >60)
GFR calc non Af Amer: >60 mL/min (ref >60)
Glucose, Bld: 103 mg/dL — ABNORMAL HIGH (ref 65–99)
Potassium: 3.3 mmol/L — ABNORMAL LOW (ref 3.5–5.1)
Sodium: 140 mmol/L (ref 135–145)

## 2017-04-09 MED ORDER — METOPROLOL SUCCINATE ER 25 MG PO TB24: 25.0000 mg | ORAL_TABLET | Freq: Every day | ORAL | 1 refills | Status: AC

## 2017-04-09 MED ORDER — LORAZEPAM 1 MG PO TABS
1.0000 mg | ORAL_TABLET | Freq: Once | ORAL | Status: AC
Start: 2017-04-09 — End: 2017-04-09
  Administered 2017-04-09: 1 mg via ORAL
  Filled 2017-04-09: qty 1

## 2017-04-09 MED ORDER — METOPROLOL TARTRATE 25 MG PO TABS
25.0000 mg | ORAL_TABLET | Freq: Once | ORAL | Status: AC
  Administered 2017-04-09: 25 mg via ORAL
  Filled 2017-04-09: qty 1

## 2017-04-09 NOTE — ED Triage Notes (Signed)
Pt states he has a hx of anxiety and hypertension and today he was out in the heat and felt overwhelmed. Hypertensive and tachycardic at this time. Not taking meds.

## 2017-04-09 NOTE — ED Notes (Signed)
Pt ambulatory and independent at discharge.  Verbalized understanding of discharge instructions 

## 2017-05-02 NOTE — ED Provider Notes (Signed)
[facility]-EMERGENCY DEPT Provider Note   CSN: [id number] Arrival date & time: 04/09/17  1335     History   Chief Complaint Chief Complaint  Patient presents with  . Anxiety  . Hypertension    HPI Matthew Short is a 31 y.o. male.  HPI   31yM with anxiety. Says he felt very hot and overwhelmed while at work today. Felt like he just needed to get out of there. A lot of stress with upcoming court concerning custody of his child.   Past Medical History:  Diagnosis Date  . GERD (gastroesophageal reflux disease)     Patient Active Problem List   Diagnosis Date Noted  . RHINITIS, ALLERGIC 10/09/2006  . GASTROESOPHAGEAL REFLUX, NO ESOPHAGITIS 10/09/2006  . HERNIA, HIATAL, NONCONGENITAL 10/09/2006  . CONSTIPATION 10/09/2006  . ECZEMA, ATOPIC DERMATITIS 10/09/2006    No past surgical history on file.     Home Medications    Prior to Admission medications   Medication Sig Start Date End Date Taking? Authorizing Provider  ALPRAZolam (XANAX) 0.25 MG tablet Take 1 tablet (0.25 mg total) by mouth 3 (three) times daily as needed for anxiety. Patient not taking: Reported on 04/09/2017 03/04/14   Loren Racer, MD  hydrochlorothiazide (HYDRODIURIL) 12.5 MG tablet Take 1 tablet (12.5 mg total) by mouth daily. Patient not taking: Reported on 04/09/2017 05/13/14   Rodolph Bong, MD  metoprolol succinate (TOPROL-XL) 25 MG 24 hr tablet Take 1 tablet (25 mg total) by mouth daily. 04/09/17   Raeford Razor, MD  metoprolol tartrate (LOPRESSOR) 25 MG tablet Take 1 tablet (25 mg total) by mouth 2 (two) times daily. Patient not taking: Reported on 04/09/2017 05/13/14   Rodolph Bong, MD    Family History No family history on file.  Social History Social History  Substance Use Topics  . Smoking status: Unknown If Ever Smoked  . Smokeless tobacco: Not on file  . Alcohol use No     Allergies   Patient has no known allergies.   Review of Systems Review of Systems  All systems  reviewed and negative, other than as noted in HPI.   Physical Exam Updated Vital Signs BP (!) 155/89   Pulse 75   Temp 98.1 F (36.7 C) (Oral)   Resp 12   Ht  (1.854 m)   Wt 104.3 kg (230 lb)   SpO2 94%   BMI 30.34 kg/m   Physical Exam  Constitutional: He is oriented to person, place, and time. He appears well-developed and well-nourished. No distress.  HENT:  Head: Normocephalic and atraumatic.  Eyes: Conjunctivae are normal. Right eye exhibits no discharge. Left eye exhibits no discharge.  Neck: Neck supple.  Cardiovascular: Normal rate, regular rhythm and normal heart sounds.  Exam reveals no gallop and no friction rub.   No murmur heard. Pulmonary/Chest: Effort normal and breath sounds normal. No respiratory distress.  Abdominal: Soft. He exhibits no distension. There is no tenderness.  Musculoskeletal: He exhibits no edema or tenderness.  Neurological: He is alert and oriented to person, place, and time. No cranial nerve deficit or sensory deficit. He exhibits normal muscle tone. Coordination normal.  Skin: Skin is warm and dry.  Psychiatric: He has a normal mood and affect. His behavior is normal. Thought content normal.  Nursing note and vitals reviewed.    ED Treatments / Results  Labs (all labs ordered are listed, but only abnormal results are displayed) Labs Reviewed  BASIC METABOLIC PANEL - Abnormal; Notable for  the following:       Result Value   Potassium 3.3 (*)    Glucose, Bld 103 (*)    Creatinine, Ser 1.40 (*)    All other components within normal limits    EKG  EKG Interpretation None       Radiology No results found.  Procedures Procedures (including critical care time)  Medications Ordered in ED Medications  metoprolol tartrate (LOPRESSOR) tablet 25 mg (25 mg Oral Given 04/09/17 1504)  LORazepam (ATIVAN) tablet 1 mg (1 mg Oral Given 04/09/17 1503)     Initial Impression / Assessment and Plan / ED Course  I have reviewed the  triage vital signs and the nursing notes.  Pertinent labs & imaging results that were available during my care of the patient were reviewed by me and considered in my medical decision making (see chart for details).     31yM with anxiety and known HTN. Feeling much better. Will restart on meds. Needs outpt FU.   Final Clinical Impressions(s) / ED Diagnoses   Final diagnoses:  Hypertension, unspecified type  Stress    New Prescriptions Discharge Medication List as of 04/09/2017  4:28 PM    START taking these medications   Details  metoprolol succinate (TOPROL-XL) 25 MG 24 hr tablet Take 1 tablet (25 mg total) by mouth daily., Starting Wed 04/09/2017, Print         Raeford Razor, MD 05/02/17 [phone]

## 2019-07-19 ENCOUNTER — Other Ambulatory Visit: Payer: Self-pay

## 2019-07-19 DIAGNOSIS — Z20822 Contact with and (suspected) exposure to covid-19: Secondary | ICD-10-CM

## 2019-07-20 LAB — NOVEL CORONAVIRUS, NAA: SARS-CoV-2, NAA: NOT DETECTED
